# Patient Record
Sex: Female | Born: 1970 | Race: Black or African American | Hispanic: No | Marital: Single | Smoking: Never smoker
Health system: Southern US, Community
[De-identification: ages and names within clinical notes are randomized; demographics above are authoritative.]

## PROBLEM LIST (undated history)

## (undated) DIAGNOSIS — N840 Polyp of corpus uteri: Secondary | ICD-10-CM

## (undated) DIAGNOSIS — N926 Irregular menstruation, unspecified: Secondary | ICD-10-CM

## (undated) DIAGNOSIS — Z973 Presence of spectacles and contact lenses: Secondary | ICD-10-CM

---

## 2013-07-11 ENCOUNTER — Other Ambulatory Visit (HOSPITAL_COMMUNITY): Payer: Self-pay | Admitting: Obstetrics and Gynecology

## 2013-07-11 DIAGNOSIS — E041 Nontoxic single thyroid nodule: Secondary | ICD-10-CM

## 2013-07-16 ENCOUNTER — Ambulatory Visit (HOSPITAL_COMMUNITY): Payer: BC Managed Care – PPO

## 2013-07-18 ENCOUNTER — Ambulatory Visit (HOSPITAL_COMMUNITY): Admission: RE | Admit: 2013-07-18 | Payer: BC Managed Care – PPO | Source: Ambulatory Visit

## 2013-07-19 ENCOUNTER — Ambulatory Visit (HOSPITAL_COMMUNITY)
Admission: RE | Admit: 2013-07-19 | Discharge: 2013-07-19 | Disposition: A | Discharge status: Home / Self Care | Payer: BC Managed Care – PPO | Source: Ambulatory Visit | Attending: Obstetrics and Gynecology | Admitting: Obstetrics and Gynecology

## 2013-07-19 DIAGNOSIS — Z1329 Encounter for screening for other suspected endocrine disorder: Secondary | ICD-10-CM | POA: Insufficient documentation

## 2013-07-19 DIAGNOSIS — E041 Nontoxic single thyroid nodule: Secondary | ICD-10-CM

## 2014-02-23 NOTE — H&P (Signed)
  Patient name  Megan Quinn, Megan Quinn DICTATION# [id number] CSN# [id number]  [facility] Center[facility]CCOMB,Romulo Okray S, MD 02/23/2014 10:14 AM

## 2014-02-24 ENCOUNTER — Encounter (HOSPITAL_BASED_OUTPATIENT_CLINIC_OR_DEPARTMENT_OTHER): Payer: Self-pay | Admitting: *Deleted

## 2014-02-24 NOTE — H&P (Signed)
NAME:  Megan Quinn, [name]                  ACCOUNT NO.:  [id number]  MEDICAL RECORD NO.:  [id number]  LOCATION:                               [facility]  PHYSICIAN:  Juluis MireJohn S. Margean Korell, M.D.   DATE OF BIRTH:  Dec 02, 1970  DATE OF ADMISSION: DATE OF DISCHARGE:                             HISTORY & PHYSICAL   Patient is a 44 year old gravida 2, para 1, female presents for hysteroscopy with MyoSure resection of several endometrial polyps.  In relation to the present admission, the patient initially was seen in our practice on May 26th.  Cycles were regular, but heavy at that time. They became progressively irregular and heavier with resultant drop in hemoglobin.  Subsequent saline infusion.  Ultrasound did reveal several large endometrial polyps for which now she presents for resection.  ALLERGIES:  In terms of allergies, she lists no known drug allergies.  MEDICATIONS:  No medications.  PAST MEDICAL HISTORY:  Usual childhood diseases.  No sequelae.  PAST SURGICAL HISTORY:  No surgical procedures listed.  FAMILY HISTORY:  Noncontributory.  SOCIAL HISTORY:  No tobacco or alcohol use.  REVIEW OF SYSTEMS:  Noncontributory.  PHYSICAL EXAMINATION:  VITAL SIGNS:  The patient is afebrile.  Stable vital signs. HEENT:  The patient is normocephalic.  Pupils equal, round, and reactive to light and accommodation.  Extraocular movements are intact.  Sclerae and conjunctivae are clear.  Oropharynx clear. NECK:  Not examined. LUNGS:  Clear. CARDIOVASCULAR SYSTEM:  Regular rate.  No murmurs or gallops. ABDOMEN:  Benign.  No mass, organomegaly, or tenderness. PELVIC:  Normal external genitalia.  Vaginal mucosa is clear.  Cervix unremarkable.  Uterus normal size, shape, and contour.  Adnexa free of mass or tenderness. EXTREMITIES:  Trace edema.  NEUROLOGIC:  Grossly within normal limits.  IMPRESSION:  Abnormal uterine bleeding with endometrial polyp.  PLAN:  The patient will undergo cervical  dilation, hysteroscopy, and resection of the polyps using the MyoSure, subsequently uterine curetting risk have been discussed including the risk of infection.  The risk of vascular injury that could lead to hemorrhage with requirement of transfusion with the risk of AIDS or hepatitis.  Excessive bleeding could require hysterectomy.  There is a risk of perforation with injury to adjacent organs such as bowel or bladder, this could require further exploratory surgery.  Risk of deep venous thrombosis and pulmonary embolus.  The patient expressed her understanding of indications and risk.     [name], M.D.     JSM/MEDQ  D:  02/23/2014  T:  02/23/2014  Job:  [id number]

## 2014-02-25 ENCOUNTER — Encounter (HOSPITAL_BASED_OUTPATIENT_CLINIC_OR_DEPARTMENT_OTHER): Payer: Self-pay | Admitting: *Deleted

## 2014-02-25 NOTE — Progress Notes (Signed)
NPO AFTER MN. ARRIVE AT 0600. PT TO GET LAB WORK DONE Wednesday 02-26-2013.

## 2014-02-26 DIAGNOSIS — N840 Polyp of corpus uteri: Secondary | ICD-10-CM | POA: Diagnosis present

## 2014-02-26 LAB — CBC
HEMATOCRIT: 31.7 % — AB (ref 36.0–46.0)
HEMOGLOBIN: 9.9 g/dL — AB (ref 12.0–15.0)
MCH: 26.3 pg (ref 26.0–34.0)
MCHC: 31.2 g/dL (ref 30.0–36.0)
MCV: 84.1 fL (ref 78.0–100.0)
Platelets: 425 10*3/uL — ABNORMAL HIGH (ref 150–400)
RBC: 3.77 MIL/uL — ABNORMAL LOW (ref 3.87–5.11)
RDW: 13.4 % (ref 11.5–15.5)
WBC: 6.5 10*3/uL (ref 4.0–10.5)

## 2014-02-26 LAB — HCG, SERUM, QUALITATIVE: Preg, Serum: NEGATIVE

## 2014-02-27 ENCOUNTER — Encounter (HOSPITAL_BASED_OUTPATIENT_CLINIC_OR_DEPARTMENT_OTHER)
Admission: RE | Disposition: A | Discharge status: Home / Self Care | Payer: Self-pay | Source: Ambulatory Visit | Attending: Obstetrics and Gynecology

## 2014-02-27 ENCOUNTER — Encounter (HOSPITAL_BASED_OUTPATIENT_CLINIC_OR_DEPARTMENT_OTHER): Payer: Self-pay | Admitting: *Deleted

## 2014-02-27 ENCOUNTER — Ambulatory Visit (HOSPITAL_BASED_OUTPATIENT_CLINIC_OR_DEPARTMENT_OTHER): Payer: BLUE CROSS/BLUE SHIELD | Admitting: Anesthesiology

## 2014-02-27 ENCOUNTER — Ambulatory Visit (HOSPITAL_BASED_OUTPATIENT_CLINIC_OR_DEPARTMENT_OTHER)
Admission: RE | Admit: 2014-02-27 | Discharge: 2014-02-27 | Disposition: A | Discharge status: Home / Self Care | Payer: BLUE CROSS/BLUE SHIELD | Source: Ambulatory Visit | Attending: Obstetrics and Gynecology | Admitting: Obstetrics and Gynecology

## 2014-02-27 DIAGNOSIS — Z9889 Other specified postprocedural states: Secondary | ICD-10-CM

## 2014-02-27 DIAGNOSIS — N840 Polyp of corpus uteri: Secondary | ICD-10-CM | POA: Diagnosis present

## 2014-02-27 HISTORY — DX: Presence of spectacles and contact lenses: Z97.3

## 2014-02-27 HISTORY — PX: DILATATION & CURETTAGE/HYSTEROSCOPY WITH MYOSURE: SHX6511

## 2014-02-27 HISTORY — DX: Irregular menstruation, unspecified: N92.6

## 2014-02-27 HISTORY — DX: Polyp of corpus uteri: N84.0

## 2014-02-27 SURGERY — DILATATION & CURETTAGE/HYSTEROSCOPY WITH MYOSURE
Anesthesia: General | Site: Vagina

## 2014-02-27 MED ORDER — LIDOCAINE HCL (CARDIAC) 20 MG/ML IV SOLN
INTRAVENOUS | Status: DC | PRN
  Administered 2014-02-27: 80 mg via INTRAVENOUS

## 2014-02-27 MED ORDER — HYDROMORPHONE HCL 1 MG/ML IJ SOLN
0.2500 mg | INTRAMUSCULAR | Status: DC | PRN
  Filled 2014-02-27: qty 1

## 2014-02-27 MED ORDER — MIDAZOLAM HCL 2 MG/2ML IJ SOLN
INTRAMUSCULAR | Status: AC
  Filled 2014-02-27: qty 2

## 2014-02-27 MED ORDER — MIDAZOLAM HCL 5 MG/5ML IJ SOLN
INTRAMUSCULAR | Status: DC | PRN
  Administered 2014-02-27: 2 mg via INTRAVENOUS

## 2014-02-27 MED ORDER — FENTANYL CITRATE 0.05 MG/ML IJ SOLN
INTRAMUSCULAR | Status: DC | PRN
  Administered 2014-02-27: 100 ug via INTRAVENOUS

## 2014-02-27 MED ORDER — ACETAMINOPHEN 10 MG/ML IV SOLN
INTRAVENOUS | Status: DC | PRN
  Administered 2014-02-27: 1000 mg via INTRAVENOUS

## 2014-02-27 MED ORDER — ONDANSETRON HCL 4 MG/2ML IJ SOLN
INTRAMUSCULAR | Status: DC | PRN
  Administered 2014-02-27: 4 mg via INTRAVENOUS

## 2014-02-27 MED ORDER — PROPOFOL 10 MG/ML IV BOLUS
INTRAVENOUS | Status: DC | PRN
  Administered 2014-02-27: 200 mg via INTRAVENOUS
  Administered 2014-02-27: 50 mg via INTRAVENOUS

## 2014-02-27 MED ORDER — CEFAZOLIN SODIUM-DEXTROSE 2-3 GM-% IV SOLR
INTRAVENOUS | Status: AC
  Filled 2014-02-27: qty 50

## 2014-02-27 MED ORDER — LACTATED RINGERS IV SOLN
INTRAVENOUS | Status: DC
  Administered 2014-02-27: 07:00:00 via INTRAVENOUS
  Filled 2014-02-27: qty 1000

## 2014-02-27 MED ORDER — LIDOCAINE-EPINEPHRINE 1 %-1:100000 IJ SOLN
INTRAMUSCULAR | Status: DC | PRN
Start: 2014-02-27 — End: 2014-02-27
  Administered 2014-02-27: 15 mL

## 2014-02-27 MED ORDER — SODIUM CHLORIDE 0.9 % IR SOLN
Status: DC | PRN
  Administered 2014-02-27: 500 mL
  Administered 2014-02-27: 3000 mL

## 2014-02-27 MED ORDER — FENTANYL CITRATE 0.05 MG/ML IJ SOLN
INTRAMUSCULAR | Status: AC
  Filled 2014-02-27: qty 2

## 2014-02-27 MED ORDER — DEXAMETHASONE SODIUM PHOSPHATE 4 MG/ML IJ SOLN
INTRAMUSCULAR | Status: DC | PRN
  Administered 2014-02-27: 10 mg via INTRAVENOUS

## 2014-02-27 MED ORDER — CEFAZOLIN SODIUM-DEXTROSE 2-3 GM-% IV SOLR
2.0000 g | INTRAVENOUS | Status: AC
  Administered 2014-02-27: 2 g via INTRAVENOUS
  Filled 2014-02-27: qty 50

## 2014-02-27 SURGICAL SUPPLY — 28 items
ABLATOR ENDOMETRIAL MYOSURE (ABLATOR) ×3 IMPLANT
AQUILEX TUBING ×3 IMPLANT
CANISTER SUCTION 2500CC (MISCELLANEOUS) ×3 IMPLANT
CATH ROBINSON RED A/P 16FR (CATHETERS) ×3 IMPLANT
COVER TABLE BACK 60X90 (DRAPES) ×3 IMPLANT
DRAPE CAMERA CLOSED 9X96 (DRAPES) ×3 IMPLANT
DRAPE LG THREE QUARTER DISP (DRAPES) ×3 IMPLANT
DRESSING TELFA ISLAND 4X8 (GAUZE/BANDAGES/DRESSINGS) ×3 IMPLANT
ELECT REM PT RETURN 9FT ADLT (ELECTROSURGICAL) ×3
ELECTRODE REM PT RTRN 9FT ADLT (ELECTROSURGICAL) ×1 IMPLANT
GLOVE BIO SURGEON STRL SZ7 (GLOVE) ×6 IMPLANT
GLOVE BIOGEL PI IND STRL 6.5 (GLOVE) ×1 IMPLANT
GLOVE BIOGEL PI IND STRL 7.5 (GLOVE) ×1 IMPLANT
GLOVE BIOGEL PI INDICATOR 6.5 (GLOVE) ×2
GLOVE BIOGEL PI INDICATOR 7.5 (GLOVE) ×2
GLOVE SURG SS PI 7.5 STRL IVOR (GLOVE) ×3 IMPLANT
GOWN STRL REUS W/ TWL XL LVL3 (GOWN DISPOSABLE) ×2 IMPLANT
GOWN STRL REUS W/TWL XL LVL3 (GOWN DISPOSABLE) ×4
IV NS IRRIG 3000ML ARTHROMATIC (IV SOLUTION) ×3 IMPLANT
LEGGING LITHOTOMY PAIR STRL (DRAPES) ×3 IMPLANT
NS IRRIG 500ML POUR BTL (IV SOLUTION) ×3 IMPLANT
PACK BASIN DAY SURGERY FS (CUSTOM PROCEDURE TRAY) ×3 IMPLANT
PAD OB MATERNITY 4.3X12.25 (PERSONAL CARE ITEMS) ×3 IMPLANT
PAD PREP 24X48 CUFFED NSTRL (MISCELLANEOUS) ×3 IMPLANT
SEAL ROD LENS SCOPE MYOSURE (ABLATOR) ×3 IMPLANT
TOWEL OR 17X24 6PK STRL BLUE (TOWEL DISPOSABLE) ×6 IMPLANT
TUBING AQUILEX INFLOW (TUBING) ×3 IMPLANT
TUBING AQUILEX OUTFLOW (TUBING) ×3 IMPLANT

## 2014-02-27 NOTE — Op Note (Signed)
NAMRosalene Quinn:  Patti, Cierra                  ACCOUNT NO.:  [id number]  MEDICAL RECORD NO.:  [id number]  LOCATION:                                 FACILITY:  PHYSICIAN:  Juluis MireJohn S. Louise Victory, M.D.   DATE OF BIRTH:  07-Aug-1970  DATE OF PROCEDURE:  02/27/2014 DATE OF DISCHARGE:  02/27/2014                              OPERATIVE REPORT   PREOPERATIVE DIAGNOSIS:  Endometrial polyp.  POSTOPERATIVE DIAGNOSIS:  Endometrial polyp.  OPERATIVE PROCEDURE:  Paracervical block, cervical dilatation, hysteroscopy with MyoSure resection of 2 endometrial polyps. Endometrial curettings.  SURGEON:  Juluis MireJohn S. Juliany Daughety, MD  ANESTHESIA:  General and paracervical block.  BLOOD LOSS:  Minimal.  PACKS AND DRAINS:  None.  INTRAOPERATIVE BLOOD PLACED:  None.  COMPLICATIONS:  None.  INDICATIONS:  Dictated in history and physical.  PROCEDURE IN DETAIL:  The patient was taken to the OR and placed in supine position.  After satisfactory level of general anesthesia was obtained, the patient was placed in the dorsal supine position using the Allen stirrups.  Perineum and vagina were prepped out with Betadine and draped as a sterile field.  A speculum was placed in the vaginal vault. The cervix was grasped with single-tooth tenaculum.  Paracervical block 1% Xylocaine with epinephrine was instituted.  Anterior lip of the cervix was grasped with single-tooth tenaculum.  Uterus sounded to approximately 9 cm.  Cervix was dilated to a size 23 Pratt dilator. Hysteroscope was introduced.  Intrauterine cavity was distended using saline.  Visualization revealed 2 posterior wall endometrial polyps. MyoSure was brought in place.  Both polyps were completely resected. The endometrium was smooth and both polyps were completely resected. There was no signs of perforation or active problems, deficit was 400 mL.  The hysteroscope was then removed.  Endometrial curettings were obtained and sent for cytology. At this point, the  single-tooth tenaculum and speculum then removed. The patient taken out of dorsal lithotomy position.  Once alert, transferred to recovery room in good condition.  Sponge, instrument, and needle counts were correct by circulating nurse.     Juluis MireJohn S. Jelani Vreeland, M.D.     JSM/MEDQ  D:  02/27/2014  T:  02/27/2014  Job:  [id number]

## 2014-02-27 NOTE — Discharge Instructions (Signed)
Dilation and Curettage or Vacuum Curettage, Care After These instructions give you information on caring for yourself after your procedure. Your doctor may also give you more specific instructions. Call your doctor if you have any problems or questions after your procedure. HOME CARE  Do not drive for 24 hours.  Wait 1 week before doing any activities that wear you out.  Take your temperature 2 times a day for 4 days. Write it down. Tell your doctor if you have a fever.  Do not stand for a long time.  Do not lift, push, or pull anything over 10 pounds (4.5 kilograms).  Limit stair climbing to once or twice a day.  Rest often.  Continue with your usual diet.  Drink enough fluids to keep your pee (urine) clear or pale yellow.  If you have a hard time pooping (constipation), you may:  Take a medicine to help you go poop (laxative) as told by your doctor.  Eat more fruit and bran.  Drink more fluids.  Take showers, not baths, for as long as told by your doctor.  Do not swim or use a hot tub until your doctor says it is okay.  Have someone with you for 1-2 days after the procedure.  Do not douche, use tampons, or have sex (intercourse) for 2 weeks.  Only take medicines as told by your doctor. Do not take aspirin. It can cause bleeding.  Keep all doctor visits. GET HELP IF:  You have cramps or pain not helped by medicine.  You have new pain in the belly (abdomen).  You have a bad smelling fluid coming from your vagina.  You have a rash.  You have problems with any medicine. GET HELP RIGHT AWAY IF:   You start to bleed more than a regular period.  You have a fever.  You have chest pain.  You have trouble breathing.  You feel dizzy or feel like passing out (fainting).  You pass out.  You have pain in the tops of your shoulders.  You have vaginal bleeding with or without clumps of blood (blood clots). MAKE SURE YOU:  Understand these instructions.  Will  watch your condition.  Will get help right away if you are not doing well or get worse. Document Released: 11/10/2007 Document Revised: 02/05/2013 Document Reviewed: 08/30/2012 [facility] Patient Information 2015 [employer] [place]. This information is not intended to replace advice given to you by your health care provider. Make sure you discuss any questions you have with your health care provider.    Post Anesthesia Home Care Instructions  Activity: Get plenty of rest for the remainder of the day. A responsible adult should stay with you for 24 hours following the procedure.  For the next 24 hours, DO NOT: -Drive a car -Advertising copywriterperate machinery -Drink alcoholic beverages -Take any medication unless instructed by your physician -Make any legal decisions or sign important papers.  Meals: Start with liquid foods such as gelatin or soup. Progress to regular foods as tolerated. Avoid greasy, spicy, heavy foods. If nausea and/or vomiting occur, drink only clear liquids until the nausea and/or vomiting subsides. Call your physician if vomiting continues.  Special Instructions/Symptoms: Your throat may feel dry or sore from the anesthesia or the breathing tube placed in your throat during surgery. If this causes discomfort, gargle with warm salt water. The discomfort should disappear within 24 hours.

## 2014-02-27 NOTE — H&P (Signed)
  History and physical exam unchanged 

## 2014-02-27 NOTE — Transfer of Care (Signed)
Immediate Anesthesia Transfer of Care Note  Patient: Megan BillingsPia Carraway  Procedure(s) Performed: Procedure(s): DILATATION & CURETTAGE/HYSTEROSCOPY WITH MYOSURE (N/A)  Patient Location: PACU  Anesthesia Type:General  Level of Consciousness: sedated and patient cooperative  Airway & Oxygen Therapy: Patient Spontanous Breathing and Patient connected to nasal cannula oxygen  Post-op Assessment: Report given to PACU RN and Post -op Vital signs reviewed and stable  Post vital signs: Reviewed and stable  Complications: No apparent anesthesia complications

## 2014-02-27 NOTE — Op Note (Signed)
Patient name  Megan Quinn, [name] DICTATION# [id number] CSN# [id number]  [name],[name], MD 02/27/2014 7:54 AM

## 2014-02-27 NOTE — Anesthesia Procedure Notes (Signed)
Procedure Name: LMA Insertion Date/Time: 02/27/2014 7:29 AM Performed by: Tyrone NineSAUVE, Beuna Bolding F Pre-anesthesia Checklist: Patient identified, Timeout performed, Emergency Drugs available, Suction available and Patient being monitored Patient Re-evaluated:Patient Re-evaluated prior to inductionOxygen Delivery Method: Circle system utilized Preoxygenation: Pre-oxygenation with 100% oxygen Intubation Type: IV induction Ventilation: Mask ventilation without difficulty LMA: LMA inserted LMA Size: 4.0 Number of attempts: 1 Airway Equipment and Method: Bite block Placement Confirmation: positive ETCO2 Tube secured with: Tape Dental Injury: Dental damage

## 2014-02-27 NOTE — Anesthesia Postprocedure Evaluation (Signed)
  Anesthesia Post-op Note  Patient: Megan BillingsPia Quinn  Procedure(s) Performed: Procedure(s): DILATATION & CURETTAGE/HYSTEROSCOPY WITH MYOSURE (N/A)  Patient Location: PACU  Anesthesia Type:General  Level of Consciousness: awake and alert   Airway and Oxygen Therapy: Patient Spontanous Breathing  Post-op Pain: none  Post-op Assessment: Post-op Vital signs reviewed, Patient's Cardiovascular Status Stable and Respiratory Function Stable  Post-op Vital Signs: Reviewed  Filed Vitals:   02/27/14 0900  BP: 123/80  Pulse: 75  Temp:   Resp: 12    Complications: No apparent anesthesia complications

## 2014-02-27 NOTE — Anesthesia Preprocedure Evaluation (Addendum)
Anesthesia Evaluation  Patient identified by MRN, date of birth, ID band Patient awake    Reviewed: Allergy & Precautions, H&P , NPO status , Patient's Chart, lab work & pertinent test results  Airway Mallampati: II  TM Distance: >3 FB Neck ROM: Full    Dental no notable dental hx. (+) Teeth Intact, Dental Advisory Given   Pulmonary neg pulmonary ROS,  breath sounds clear to auscultation  Pulmonary exam normal       Cardiovascular Exercise Tolerance: Good negative cardio ROS  Rhythm:Regular Rate:Normal     Neuro/Psych negative neurological ROS  negative psych ROS   GI/Hepatic negative GI ROS, Neg liver ROS,   Endo/Other  negative endocrine ROS  Renal/GU negative Renal ROS  negative genitourinary   Musculoskeletal   Abdominal   Peds  Hematology negative hematology ROS (+)   Anesthesia Other Findings   Reproductive/Obstetrics negative OB ROS                            Anesthesia Physical Anesthesia Plan  ASA: I  Anesthesia Plan: General   Post-op Pain Management:    Induction: Intravenous  Airway Management Planned: LMA and Oral ETT  Additional Equipment:   Intra-op Plan:   Post-operative Plan:   Informed Consent: I have reviewed the patients History and Physical, chart, labs and discussed the procedure including the risks, benefits and alternatives for the proposed anesthesia with the patient or authorized representative who has indicated his/her understanding and acceptance.   Dental advisory given  Plan Discussed with: CRNA  Anesthesia Plan Comments:         Anesthesia Quick Evaluation

## 2014-02-28 ENCOUNTER — Encounter (HOSPITAL_BASED_OUTPATIENT_CLINIC_OR_DEPARTMENT_OTHER): Payer: Self-pay | Admitting: Obstetrics and Gynecology

## 2014-09-17 ENCOUNTER — Other Ambulatory Visit: Payer: Self-pay | Admitting: Obstetrics and Gynecology

## 2014-09-18 LAB — CYTOLOGY - PAP

## 2015-12-24 ENCOUNTER — Encounter: Payer: Self-pay | Admitting: Obstetrics and Gynecology

## 2018-04-03 HISTORY — PX: REDUCTION MAMMAPLASTY: SUR839

## 2018-10-12 ENCOUNTER — Other Ambulatory Visit: Payer: Self-pay | Admitting: Obstetrics and Gynecology

## 2018-10-12 DIAGNOSIS — N6310 Unspecified lump in the right breast, unspecified quadrant: Secondary | ICD-10-CM

## 2018-10-12 LAB — HM PAP SMEAR: HM Pap smear: NORMAL

## 2018-10-26 ENCOUNTER — Ambulatory Visit
Admission: RE | Admit: 2018-10-26 | Discharge: 2018-10-26 | Disposition: A | Discharge status: Home / Self Care | Payer: BLUE CROSS/BLUE SHIELD | Source: Ambulatory Visit | Attending: Obstetrics and Gynecology | Admitting: Obstetrics and Gynecology

## 2018-10-26 ENCOUNTER — Other Ambulatory Visit: Payer: Self-pay | Admitting: Obstetrics and Gynecology

## 2018-10-26 ENCOUNTER — Ambulatory Visit
Admission: RE | Admit: 2018-10-26 | Discharge: 2018-10-26 | Disposition: A | Discharge status: Home / Self Care | Payer: BC Managed Care – PPO | Source: Ambulatory Visit | Attending: Obstetrics and Gynecology | Admitting: Obstetrics and Gynecology

## 2018-10-26 ENCOUNTER — Other Ambulatory Visit: Payer: Self-pay

## 2018-10-26 DIAGNOSIS — N6320 Unspecified lump in the left breast, unspecified quadrant: Secondary | ICD-10-CM

## 2018-10-26 DIAGNOSIS — N6310 Unspecified lump in the right breast, unspecified quadrant: Secondary | ICD-10-CM

## 2018-12-20 LAB — BASIC METABOLIC PANEL
BUN: 12 (ref 4–21)
CO2: 21 (ref 13–22)
Chloride: 102 (ref 99–108)
Creatinine: 1.1 (ref 0.5–1.1)
Glucose: 103
Potassium: 4.6 (ref 3.4–5.3)
Sodium: 140 (ref 137–147)

## 2018-12-20 LAB — COMPREHENSIVE METABOLIC PANEL
Albumin: 4.4 (ref 3.5–5.0)
Calcium: 10 (ref 8.7–10.7)
GFR calc Af Amer: 72
GFR calc non Af Amer: 63
Globulin: 2.9

## 2018-12-20 LAB — HEPATIC FUNCTION PANEL
ALT: 17 (ref 7–35)
AST: 20 (ref 13–35)
Alkaline Phosphatase: 45 (ref 25–125)
Bilirubin, Total: 0.4

## 2018-12-20 LAB — TSH: TSH: 2.94 (ref 0.41–5.90)

## 2018-12-20 LAB — LIPID PANEL
Cholesterol: 266 — AB (ref 0–200)
HDL: 57 (ref 35–70)
LDL Cholesterol: 191
Triglycerides: 103 (ref 40–160)

## 2019-03-13 ENCOUNTER — Encounter: Payer: Self-pay | Admitting: Obstetrics and Gynecology

## 2019-05-20 ENCOUNTER — Ambulatory Visit: Payer: BC Managed Care – PPO | Admitting: Family Medicine

## 2019-05-20 DIAGNOSIS — Z0289 Encounter for other administrative examinations: Secondary | ICD-10-CM

## 2019-10-07 ENCOUNTER — Other Ambulatory Visit: Payer: Self-pay

## 2019-10-07 ENCOUNTER — Observation Stay (HOSPITAL_COMMUNITY)
Admission: EM | Admit: 2019-10-07 | Discharge: 2019-10-08 | Disposition: A | Discharge status: Home / Self Care | Payer: BC Managed Care – PPO | Attending: Obstetrics & Gynecology | Admitting: Obstetrics & Gynecology

## 2019-10-07 DIAGNOSIS — D500 Iron deficiency anemia secondary to blood loss (chronic): Secondary | ICD-10-CM | POA: Insufficient documentation

## 2019-10-07 DIAGNOSIS — D62 Acute posthemorrhagic anemia: Secondary | ICD-10-CM

## 2019-10-07 DIAGNOSIS — N939 Abnormal uterine and vaginal bleeding, unspecified: Secondary | ICD-10-CM | POA: Diagnosis not present

## 2019-10-07 DIAGNOSIS — R0602 Shortness of breath: Secondary | ICD-10-CM | POA: Insufficient documentation

## 2019-10-07 DIAGNOSIS — D649 Anemia, unspecified: Secondary | ICD-10-CM | POA: Diagnosis present

## 2019-10-07 DIAGNOSIS — R42 Dizziness and giddiness: Secondary | ICD-10-CM | POA: Insufficient documentation

## 2019-10-07 DIAGNOSIS — N920 Excessive and frequent menstruation with regular cycle: Secondary | ICD-10-CM

## 2019-10-07 DIAGNOSIS — R000 Tachycardia, unspecified: Secondary | ICD-10-CM | POA: Insufficient documentation

## 2019-10-07 DIAGNOSIS — Z20822 Contact with and (suspected) exposure to covid-19: Secondary | ICD-10-CM | POA: Diagnosis not present

## 2019-10-07 LAB — COMPREHENSIVE METABOLIC PANEL
ALT: 15 U/L (ref 0–44)
AST: 20 U/L (ref 15–41)
Albumin: 3.4 g/dL — ABNORMAL LOW (ref 3.5–5.0)
Alkaline Phosphatase: 23 U/L — ABNORMAL LOW (ref 38–126)
Anion gap: 7 (ref 5–15)
BUN: 8 mg/dL (ref 6–20)
CO2: 25 mmol/L (ref 22–32)
Calcium: 8.6 mg/dL — ABNORMAL LOW (ref 8.9–10.3)
Chloride: 106 mmol/L (ref 98–111)
Creatinine, Ser: 0.98 mg/dL (ref 0.44–1.00)
GFR calc Af Amer: >60 mL/min (ref >60)
GFR calc non Af Amer: >60 mL/min (ref >60)
Glucose, Bld: 100 mg/dL — ABNORMAL HIGH (ref 70–99)
Potassium: 4.1 mmol/L (ref 3.5–5.1)
Sodium: 138 mmol/L (ref 135–145)
Total Bilirubin: 0.6 mg/dL (ref 0.3–1.2)
Total Protein: 6.1 g/dL — ABNORMAL LOW (ref 6.5–8.1)

## 2019-10-07 LAB — CBC
HCT: 18.3 % — ABNORMAL LOW (ref 36.0–46.0)
Hemoglobin: 5.6 g/dL — CL (ref 12.0–15.0)
MCH: 28.3 pg (ref 26.0–34.0)
MCHC: 30.6 g/dL (ref 30.0–36.0)
MCV: 92.4 fL (ref 80.0–100.0)
Platelets: 421 10*3/uL — ABNORMAL HIGH (ref 150–400)
RBC: 1.98 MIL/uL — ABNORMAL LOW (ref 3.87–5.11)
RDW: 11.9 % (ref 11.5–15.5)
WBC: 8.1 10*3/uL (ref 4.0–10.5)
nRBC: 0 % (ref 0.0–0.2)

## 2019-10-07 LAB — I-STAT BETA HCG BLOOD, ED (MC, WL, AP ONLY): I-stat hCG, quantitative: <5 m[IU]/mL (ref <5)

## 2019-10-07 NOTE — ED Triage Notes (Signed)
Pt reports heavy vaginal bleeding for the last 2 weeks. Per patient was told to come to ER for blood transfusion due to low hemoglobin. PT awake, alert, appropriate, NAD at present.

## 2019-10-08 ENCOUNTER — Observation Stay (HOSPITAL_COMMUNITY): Payer: BC Managed Care – PPO

## 2019-10-08 DIAGNOSIS — D649 Anemia, unspecified: Secondary | ICD-10-CM | POA: Diagnosis present

## 2019-10-08 LAB — HEMOGLOBIN AND HEMATOCRIT, BLOOD
HCT: 26.3 % — ABNORMAL LOW (ref 36.0–46.0)
Hemoglobin: 8.3 g/dL — ABNORMAL LOW (ref 12.0–15.0)

## 2019-10-08 LAB — ABO/RH: ABO/RH(D): A POS

## 2019-10-08 LAB — PREPARE RBC (CROSSMATCH)

## 2019-10-08 LAB — SARS CORONAVIRUS 2 BY RT PCR (HOSPITAL ORDER, PERFORMED IN ~~LOC~~ HOSPITAL LAB): SARS Coronavirus 2: NEGATIVE

## 2019-10-08 MED ORDER — SODIUM CHLORIDE 0.9% IV SOLUTION: Freq: Once | INTRAVENOUS | Status: DC

## 2019-10-08 NOTE — H&P (Signed)
Megan Quinn is an 49 y.o. female sent from office yesterday for blood transfusion when she presented for evaluation of heavy vaginal bleeding with hemoglobin of 4.8.  The patient states she has been experiencing irregular menses for several months now and when she has been seen by her primary, Dr. Arelia Sneddon, was diagnosed with perimenopause.  Her symptoms were tolerated until 8/6 when she began having extremely heavy vaginal bleeding requiring her to leave work [employer]).  She continued to have almost daily bleeding since that time which is occasionally extremely heavy; never painful.  She noticed her heart racing and severe fatigue which is not typical for her.  Historically, the patient was diagnosed with endometrial polyps in 01/2014 and underwent H/S, D&C with benign pathology.  A few small (2 cm and smaller) fibroids were noted at that time.  Patient's partner is s/p vasectomy and patient takes no hormonal medication.  Also of note, patient has h/o cervical dysplasia and had LEEP in 2019.  Currently, the patient reports no vaginal bleeding since yesterday.  She reports no cardiac symptoms but she has not tried standing for some time.  She denies CP/SOB.  Patient is otherwise in good health with h/o anemia years ago with hemoglobin 9.    Pertinent Gynecological History: Menses: flow is excessive with use of innumberable pads or tampons on heaviest days Bleeding: dysfunctional uterine bleeding Contraception: vasectomy DES exposure: unknown Blood transfusions: currently receiving; none previously Sexually transmitted diseases: no past history Previous GYN Procedures: DNC, LEEP Last mammogram: normal Date: 10/26/2018 Last pap: normal Date: 04/16/2019 OB History: G2, P1011   Menstrual History: Menarche age: n/a Patient's last menstrual period was 09/23/2019.    Past Medical History:  Diagnosis Date  . Endometrial polyp   . Irregular menstrual cycle   . Wears contact lenses      Past Surgical History:  Procedure Laterality Date  . DILATATION & CURETTAGE/HYSTEROSCOPY WITH MYOSURE N/A 02/27/2014   Procedure: DILATATION & CURETTAGE/HYSTEROSCOPY WITH MYOSURE;  Surgeon: Juluis Mire, MD;  Location: [facility];  Service: Gynecology;  Laterality: N/A;  . REDUCTION MAMMAPLASTY Bilateral 04/03/2018    No family history on file.  Social History:  reports that she has never smoked. She has never used smokeless tobacco. She reports that she does not drink alcohol and does not use drugs.  Allergies:  Allergies  Allergen Reactions  . Ibuprofen Swelling    EYES SWELL    (Not in a hospital admission)   Review of Systems  Blood pressure 105/72, pulse 82, temperature 98.5 F (36.9 C), temperature source Oral, resp. rate 20, height 5\' 4"  (1.626 m), weight 74.8 kg, last menstrual period 09/23/2019, SpO2 100 %. Physical Exam Constitutional:      Appearance: Normal appearance.  HENT:     Head: Normocephalic.  Cardiovascular:     Rate and Rhythm: Normal rate and regular rhythm.  Pulmonary:     Effort: Pulmonary effort is normal.  Abdominal:     General: There is no distension.     Palpations: Abdomen is soft. There is no mass.     Tenderness: There is no abdominal tenderness. There is no guarding or rebound.  Genitourinary:    General: Normal vulva.     Comments: No current vaginal bleeding on exam  Musculoskeletal:        General: Normal range of motion.     Cervical back: Normal range of motion.  Skin:    General: Skin is warm and dry.  Neurological:     Mental Status: She is alert.  Psychiatric:        Mood and Affect: Mood normal.        Behavior: Behavior normal.     Results for orders placed or performed during the hospital encounter of 10/07/19 (from the past 24 hour(s))  CBC     Status: Abnormal   Collection Time: 10/07/19  2:09 PM  Result Value Ref Range   WBC 8.1 4.0 - 10.5 K/uL   RBC 1.98 (L) 3.87 - 5.11 MIL/uL    Hemoglobin 5.6 (LL) 12.0 - 15.0 g/dL   HCT 16.1 (L) 36 - 46 %   MCV 92.4 80.0 - 100.0 fL   MCH 28.3 26.0 - 34.0 pg   MCHC 30.6 30.0 - 36.0 g/dL   RDW 09.6 04.5 - 40.9 %   Platelets 421 (H) 150 - 400 K/uL   nRBC 0.0 0.0 - 0.2 %  Comprehensive metabolic panel     Status: Abnormal   Collection Time: 10/07/19  2:09 PM  Result Value Ref Range   Sodium 138 135 - 145 mmol/L   Potassium 4.1 3.5 - 5.1 mmol/L   Chloride 106 98 - 111 mmol/L   CO2 25 22 - 32 mmol/L   Glucose, Bld 100 (H) 70 - 99 mg/dL   BUN 8 6 - 20 mg/dL   Creatinine, Ser 8.11 0.44 - 1.00 mg/dL   Calcium 8.6 (L) 8.9 - 10.3 mg/dL   Total Protein 6.1 (L) 6.5 - 8.1 g/dL   Albumin 3.4 (L) 3.5 - 5.0 g/dL   AST 20 15 - 41 U/L   ALT 15 0 - 44 U/L   Alkaline Phosphatase 23 (L) 38 - 126 U/L   Total Bilirubin 0.6 0.3 - 1.2 mg/dL   GFR calc non Af Amer >60 >60 mL/min   GFR calc Af Amer >60 >60 mL/min   Anion gap 7 5 - 15  Type and screen [facility]     Status: None (Preliminary result)   Collection Time: 10/07/19  2:09 PM  Result Value Ref Range   ABO/RH(D) A POS    Antibody Screen NEG    Sample Expiration 10/10/2019,2359    Unit Number [id number]    Blood Component Type RED CELLS,LR    Unit division 00    Status of Unit ISSUED    Transfusion Status OK TO TRANSFUSE    Crossmatch Result      Compatible Performed at [facility], [address] [place], [place] [address]    Unit Number [id number]    Blood Component Type RED CELLS,LR    Unit division 00    Status of Unit ALLOCATED    Transfusion Status OK TO TRANSFUSE    Crossmatch Result Compatible   I-Stat beta hCG blood, ED     Status: None   Collection Time: 10/07/19  2:32 PM  Result Value Ref Range   I-stat hCG, quantitative <5.0 <5 mIU/mL   Comment 3          SARS Coronavirus 2 by RT PCR (hospital order, performed in [facility] hospital lab) Nasopharyngeal Nasopharyngeal Swab     Status: None   Collection Time: 10/08/19  6:25 AM    Specimen: Nasopharyngeal Swab  Result Value Ref Range   SARS Coronavirus 2 NEGATIVE NEGATIVE  Prepare RBC (crossmatch)     Status: None   Collection Time: 10/08/19  6:31 AM  Result Value Ref Range  Order Confirmation      ORDER PROCESSED BY BLOOD BANK Performed at [facility], [address], [place], [place] [address]   ABO/Rh     Status: None   Collection Time: 10/08/19  8:49 AM  Result Value Ref Range   ABO/RH(D)      A POS Performed at [facility], [address], [place], [place] [address]     US PELVIC COMPLETE WITH TRANSVAGINAL  Result Date: 10/08/2019 CLINICAL DATA:  Menorrhagia since 09/24/2019. EXAM: TRANSABDOMINAL AND TRANSVAGINAL ULTRASOUND OF PELVIS TECHNIQUE: Both transabdominal and transvaginal ultrasound examinations of the pelvis were performed. Transabdominal technique was performed for global imaging of the pelvis including uterus, ovaries, adnexal regions, and pelvic cul-de-sac. It was necessary to proceed with endovaginal exam following the transabdominal exam to visualize the endometrium. COMPARISON:  None FINDINGS: Uterus Measurements: 12.5 x 4.6 x 5.4 cm = volume: 164 mL. Three fibroids are identified. The largest is in the lower uterine segment measuring 5.6 x 3.3 x 5.7 cm. A second fibroid positioned posteriorly near the fundus measures 3.3 cm in diameter and a third to the right at the anterior aspect of the fundus measures 3.1 cm in diameter. Endometrium Thickness: 10 mm.  No focal abnormality visualized. Right ovary Measurements: 3.6 x 2.1 x 2.3 = volume: 8.9 mL. Normal appearance/no adnexal mass. Left ovary Measurements: 5.0 x 3.4 x 4.0 = volume: 35.6 mL. Normal appearance/no adnexal mass. Other findings No abnormal free fluid. IMPRESSION: The examination is positive for 3 uterine fibroids. The largest fibroid is in the lower uterine segment and measures 5.7 cm in diameter. Electronically Signed   By: Drusilla Kanner M.D.   On: 10/08/2019 11:07     Assessment/Plan: 48yo with heavy, irregular menstrual bleeding -Patient is counseled re: today's u/s findings.  The patient is informed of interval enlargement of multiple fibroids; largest measures 5.7 cm.  Normal appearing ovaries.  I suspect given recent change in menses this is mostly associated with perimenopausal syndrome, however, partially may be attributable to leiomyomata.   -Patient to receive 2u PRBCs and post-transfusion hemoglobin.   -Patient counseled re: recommendation to consider medication vs surgical management given severity of anemia.  She is offered hormonal tx, TXA and hysterectomy.  She prefers to not use hormones (refuses IUD) and is adamantly opposed to hysterectomy.  Will plan TXA at this time which will be prescribed for her to pick up today.  She understands it is to be taken prn and no greater than 5 days each month.  Should this not be adequate tx to transition to menopause, will consider hormonal medication.  -Patient to f/u in the office on Monday for recheck.  Mitchel Honour 10/08/2019, 1:28 PM

## 2019-10-08 NOTE — ED Provider Notes (Addendum)
[facility] EMERGENCY DEPARTMENT Provider Note   CSN: [id number] Arrival date & time: 10/07/19  1234     History Chief Complaint  Patient presents with  . Vaginal Bleeding    Megan Quinn is a 49 y.o. female.  HPI Patient presents with anemia and heavy GI bleeding. Has had increasingly heavy menses over the last few months but or last month much heavier. Had not been even regular would have heavy days then light days. However over the last couple days is more lightheadedness and dizziness. Had been seen at [facility] ER and told she was anemic but not given a number. Followed up with OB/GYN after she became more short of breath and dizzy. Fingerstick hemoglobin was reportedly 4. Get seen by Dr. Legrand Pitts and [facility]. Dr. Vincente Poli reportedly told her to come in for transfusion.    Past Medical History:  Diagnosis Date  . Endometrial polyp   . Irregular menstrual cycle   . Wears contact lenses     Patient Active Problem List   Diagnosis Date Noted  . Endometrial polyp 02/27/2014    Class: Present on Admission  . Status post hysteroscopic polypectomy 02/27/2014    Class: Status post    Past Surgical History:  Procedure Laterality Date  . DILATATION & CURETTAGE/HYSTEROSCOPY WITH MYOSURE N/A 02/27/2014   Procedure: DILATATION & CURETTAGE/HYSTEROSCOPY WITH MYOSURE;  Surgeon: Juluis Mire, MD;  Location: [facility];  Service: Gynecology;  Laterality: N/A;  . REDUCTION MAMMAPLASTY Bilateral 04/03/2018     OB History   No obstetric history on file.     No family history on file.  Social History   Tobacco Use  . Smoking status: Never Smoker  . Smokeless tobacco: Never Used  Substance Use Topics  . Alcohol use: No  . Drug use: No    Home Medications Prior to Admission medications   Not on File    Allergies    Ibuprofen  Review of Systems   Review of Systems  Constitutional: Negative for appetite change.  HENT:  Negative for congestion.   Respiratory: Positive for shortness of breath.   Cardiovascular: Negative for chest pain.  Gastrointestinal: Negative for abdominal pain.  Genitourinary: Positive for vaginal bleeding.  Musculoskeletal: Negative for back pain.  Skin: Negative for rash.  Neurological: Positive for dizziness and light-headedness.  Psychiatric/Behavioral: Negative for confusion.    Physical Exam Updated Vital Signs BP (!) 172/99   Pulse (!) 120   Temp (!) 97.4 F (36.3 C) (Oral)   Resp 19   Ht 5\' 4"  (1.626 m)   Wt 74.8 kg   LMP 09/23/2019   SpO2 100%   BMI 28.32 kg/m   Physical Exam Vitals and nursing note reviewed.  HENT:     Head: Atraumatic.  Eyes:     Pupils: Pupils are equal, round, and reactive to light.  Cardiovascular:     Rate and Rhythm: Tachycardia present.  Pulmonary:     Effort: Pulmonary effort is normal.  Abdominal:     Tenderness: There is no abdominal tenderness.  Genitourinary:    Comments: Mild vaginal bleeding. No red blood in vault. Musculoskeletal:     Cervical back: Neck supple.  Skin:    Coloration: Skin is pale.  Neurological:     Mental Status: She is alert and oriented to person, place, and time.     ED Results / Procedures / Treatments   Labs (all labs ordered are listed, but only abnormal  results are displayed) Labs Reviewed  CBC - Abnormal; Notable for the following components:      Result Value   RBC 1.98 (*)    Hemoglobin 5.6 (*)    HCT 18.3 (*)    Platelets 421 (*)    All other components within normal limits  COMPREHENSIVE METABOLIC PANEL - Abnormal; Notable for the following components:   Glucose, Bld 100 (*)    Calcium 8.6 (*)    Total Protein 6.1 (*)    Albumin 3.4 (*)    Alkaline Phosphatase 23 (*)    All other components within normal limits  I-STAT BETA HCG BLOOD, ED ([facility], [facility], [facility] ONLY)  TYPE AND SCREEN    EKG None  Radiology No results found.  Procedures Procedures (including critical care  time)  Medications Ordered in ED Medications - No data to display  ED Course  I have reviewed the triage vital signs and the nursing notes.  Pertinent labs & imaging results that were available during my care of the patient were reviewed by me and considered in my medical decision making (see chart for details).    MDM Rules/Calculators/A&P                         Patient with acute blood loss anemia. Symptomatic. Dizzy. Hemoglobin 5.6. Discussed with Dr. Vincente Poli. Thinks patient can be admitted to the medicine doctors. Recommends transfusion but does not necessarily need ultrasound now. That can be arranged as an outpatient for the procedures she needs. Does suggest Megace 20 mg a day up until the procedure. Will discuss with unassigned medicine for admission.  CRITICAL CARE Performed by: Benjiman Core Total critical care time: 30 minutes Critical care time was exclusive of separately billable procedures and treating other patients. Critical care was necessary to treat or prevent imminent or life-threatening deterioration. Critical care was time spent personally by me on the following activities: development of treatment plan with patient and/or surrogate as well as nursing, discussions with consultants, evaluation of patient's response to treatment, examination of patient, obtaining history from patient or surrogate, ordering and performing treatments and interventions, ordering and review of laboratory studies, ordering and review of radiographic studies, pulse oximetry and re-evaluation of patient's condition.  Final Clinical Impression(s) / ED Diagnoses Final diagnoses:  Abnormal uterine bleeding (AUB)  Acute blood loss anemia    Rx / DC Orders ED Discharge Orders    None       Benjiman Core, MD 10/08/19 [id number]    Benjiman Core, MD 10/08/19 [phone]

## 2019-10-08 NOTE — Progress Notes (Signed)
Patient was accepted in carryover by Dr. Rachael Darby, with sign out as below:  49 year old female who was sent to the emergency room by the OB/GYN office with anemia and vaginal bleeding. Found to have hemoglobin below 6. ER doctor discussed with GYN who recommended giving Megace and transfusing blood and remainder of work-up can be done as an outpatient. GYN wanted medicine to admit. Per ER doctor GYN did not want ultrasound obtained as they would do it as an outpatient  I would consult GYN so they can manage hormone therapy.   Our current service line agreement with OB/GYN states that patients presenting with a primary obstetrical/gynecologic issue should be admitted by their service.  The patient does not appear to have other active medical problems.  As such, I discussed the patient with the on-call doctor, Mitchel Honour, who has agreed to assume care of the patient.   Georgana Curio, M.D.

## 2019-10-08 NOTE — ED Notes (Addendum)
Pt Stated she was getting treated different she had insurance just like everybody else. She ask why has she being sitting in lobby for so long when doctor sent her to [facility]. The staff in the lobby explained the wait. Charge nurse is aware.

## 2019-10-09 LAB — TYPE AND SCREEN
ABO/RH(D): A POS
Antibody Screen: NEGATIVE
Unit division: 0
Unit division: 0

## 2019-10-09 LAB — BPAM RBC
Blood Product Expiration Date: 202109112359
Blood Product Expiration Date: 202109112359
ISSUE DATE / TIME: 202108241206
ISSUE DATE / TIME: 202108241543
Unit Type and Rh: 6200
Unit Type and Rh: 6200

## 2021-01-21 ENCOUNTER — Other Ambulatory Visit: Payer: Self-pay | Admitting: Obstetrics and Gynecology

## 2021-01-21 DIAGNOSIS — R928 Other abnormal and inconclusive findings on diagnostic imaging of breast: Secondary | ICD-10-CM

## 2021-03-20 ENCOUNTER — Other Ambulatory Visit: Payer: Self-pay

## 2021-03-20 ENCOUNTER — Ambulatory Visit
Admission: RE | Admit: 2021-03-20 | Discharge: 2021-03-20 | Disposition: A | Discharge status: Home / Self Care | Payer: BC Managed Care – PPO | Source: Ambulatory Visit | Attending: Obstetrics and Gynecology | Admitting: Obstetrics and Gynecology

## 2021-03-20 ENCOUNTER — Other Ambulatory Visit: Payer: Self-pay | Admitting: Obstetrics and Gynecology

## 2021-03-20 DIAGNOSIS — R928 Other abnormal and inconclusive findings on diagnostic imaging of breast: Secondary | ICD-10-CM

## 2021-03-20 DIAGNOSIS — R921 Mammographic calcification found on diagnostic imaging of breast: Secondary | ICD-10-CM

## 2021-04-15 ENCOUNTER — Ambulatory Visit
Admission: RE | Admit: 2021-04-15 | Discharge: 2021-04-15 | Disposition: A | Discharge status: Home / Self Care | Payer: BC Managed Care – PPO | Source: Ambulatory Visit | Attending: Obstetrics and Gynecology | Admitting: Obstetrics and Gynecology

## 2021-04-15 DIAGNOSIS — R921 Mammographic calcification found on diagnostic imaging of breast: Secondary | ICD-10-CM

## 2021-04-15 DIAGNOSIS — R928 Other abnormal and inconclusive findings on diagnostic imaging of breast: Secondary | ICD-10-CM

## 2022-06-09 ENCOUNTER — Other Ambulatory Visit: Payer: Self-pay | Admitting: Obstetrics and Gynecology

## 2022-06-09 DIAGNOSIS — Z1231 Encounter for screening mammogram for malignant neoplasm of breast: Secondary | ICD-10-CM

## 2022-06-22 ENCOUNTER — Encounter: Payer: Self-pay | Admitting: Internal Medicine

## 2022-07-06 ENCOUNTER — Ambulatory Visit (AMBULATORY_SURGERY_CENTER): Payer: BC Managed Care – PPO

## 2022-07-06 VITALS — Ht 64.0 in | Wt 162.0 lb

## 2022-07-06 DIAGNOSIS — Z1211 Encounter for screening for malignant neoplasm of colon: Secondary | ICD-10-CM

## 2022-07-06 MED ORDER — NA SULFATE-K SULFATE-MG SULF 17.5-3.13-1.6 GM/177ML PO SOLN: 1.0000 | Freq: Once | ORAL | 0 refills | Status: AC

## 2022-07-06 NOTE — Progress Notes (Signed)
No egg or soy allergy known to patient  No issues known to pt with past sedation with any surgeries or procedures Patient denies ever being told they had issues or difficulty with intubation  No FH of Malignant Hyperthermia Pt is not on diet pills Pt is not on  home 02  Pt is not on blood thinners  Pt has issue with constipation  No A fib or A flutter Have any cardiac testing pending-- Pt instructed to use Single[employer]com or GoodRx for a price reduction on prep  Can ambulate without assistance

## 2022-07-07 ENCOUNTER — Encounter: Payer: Self-pay | Admitting: Internal Medicine

## 2022-07-14 ENCOUNTER — Ambulatory Visit: Payer: BC Managed Care – PPO

## 2022-07-18 ENCOUNTER — Telehealth: Payer: Self-pay | Admitting: Internal Medicine

## 2022-07-18 NOTE — Telephone Encounter (Signed)
Inbound call from patient requesting to speak with a nurse, she is schedule for a procedure 6/5, she stated she is been taking Amoxicillin and wan to know when does she need to stop take it.Please advise

## 2022-07-18 NOTE — Telephone Encounter (Signed)
Called patient back and she had a question about being on amoxicillin prescribed by the dentist, wondering if it would be ok if she took it the morning of her procedure. I advised that it should be fine and to take with plenty of clear liquids so it wouldn't upset her stomach. She agreed and said that it would be her last dose and it has not upset her stomach at all. No further questions.

## 2022-07-20 ENCOUNTER — Ambulatory Visit (AMBULATORY_SURGERY_CENTER): Payer: BC Managed Care – PPO | Admitting: Internal Medicine

## 2022-07-20 ENCOUNTER — Encounter: Payer: Self-pay | Admitting: Internal Medicine

## 2022-07-20 VITALS — BP 134/79 | HR 58 | Temp 98.7°F | Resp 13 | Ht 64.0 in | Wt 162.0 lb

## 2022-07-20 DIAGNOSIS — D122 Benign neoplasm of ascending colon: Secondary | ICD-10-CM | POA: Diagnosis not present

## 2022-07-20 DIAGNOSIS — D123 Benign neoplasm of transverse colon: Secondary | ICD-10-CM | POA: Diagnosis not present

## 2022-07-20 DIAGNOSIS — D120 Benign neoplasm of cecum: Secondary | ICD-10-CM

## 2022-07-20 DIAGNOSIS — Z1211 Encounter for screening for malignant neoplasm of colon: Secondary | ICD-10-CM | POA: Diagnosis present

## 2022-07-20 MED ORDER — SODIUM CHLORIDE 0.9 % IV SOLN: 500.0000 mL | Freq: Once | INTRAVENOUS | Status: DC

## 2022-07-20 NOTE — Op Note (Signed)
[facility] Patient Name: Megan Quinn Procedure Date: 07/20/2022 2:17 PM MRN: [id number] Endoscopist: Particia Lather , , [id number] Age: 52 Referring MD:  Date of Birth: January 01, 1971 Gender: Female Account #: [id number] Procedure:                Colonoscopy Indications:              Screening for colorectal malignant neoplasm, This                            is the patient's first colonoscopy Medicines:                Monitored Anesthesia Care Procedure:                Pre-Anesthesia Assessment:                           - Prior to the procedure, a History and Physical                            was performed, and patient medications and                            allergies were reviewed. The patient's tolerance of                            previous anesthesia was also reviewed. The risks                            and benefits of the procedure and the sedation                            options and risks were discussed with the patient.                            All questions were answered, and informed consent                            was obtained. Prior Anticoagulants: The patient has                            taken no anticoagulant or antiplatelet agents. ASA                            Grade Assessment: II - A patient with mild systemic                            disease. After reviewing the risks and benefits,                            the patient was deemed in satisfactory condition to                            undergo the procedure.  After obtaining informed consent, the colonoscope                            was passed under direct vision. Throughout the                            procedure, the patient's blood pressure, pulse, and                            oxygen saturations were monitored continuously. The                            Olympus Scope SN [id number] was introduced through the                            anus and advanced  to the the terminal ileum. The                            colonoscopy was performed without difficulty. The                            patient tolerated the procedure well. The quality                            of the bowel preparation was excellent. The                            terminal ileum, ileocecal valve, appendiceal                            orifice, and rectum were photographed. Scope In: 2:44:39 PM Scope Out: 3:01:33 PM Scope Withdrawal Time: 0 hours 13 minutes 17 seconds  Total Procedure Duration: 0 hours 16 minutes 54 seconds  Findings:                 The terminal ileum appeared normal.                           Four sessile polyps were found in the transverse                            colon, ascending colon and cecum. The polyps were 3                            to 5 mm in size. These polyps were removed with a                            cold snare. Resection and retrieval were complete.                           Non-bleeding internal hemorrhoids were found during                            retroflexion. Complications:  No immediate complications. Estimated Blood Loss:     Estimated blood loss was minimal. Impression:               - The examined portion of the ileum was normal.                           - Four 3 to 5 mm polyps in the transverse colon, in                            the ascending colon and in the cecum, removed with                            a cold snare. Resected and retrieved.                           - Non-bleeding internal hemorrhoids. Recommendation:           - Discharge patient to home (with escort).                           - Await pathology results.                           - The findings and recommendations were discussed                            with the patient. Dr Particia Lather "Alan Ripper" Leonides Schanz,  07/20/2022 3:04:34 PM

## 2022-07-20 NOTE — Progress Notes (Signed)
GASTROENTEROLOGY PROCEDURE H&P NOTE   Primary Care Physician: Richardean Chimera, MD    Reason for Procedure:   Colon cancer screening  Plan:    Colonoscopy  Patient is appropriate for endoscopic procedure(s) in the ambulatory ([facility]) setting.  The nature of the procedure, as well as the risks, benefits, and alternatives were carefully and thoroughly reviewed with the patient. Ample time for discussion and questions allowed. The patient understood, was satisfied, and agreed to proceed.     HPI: Megan Quinn is a 52 y.o. female who presents for colonoscopy for colon cancer screening. Denies blood in stools, changes in bowel habits, or unintentional weight loss. Denies family history of colon cancer. This is her first colonoscopy  Past Medical History:  Diagnosis Date   Endometrial polyp    Irregular menstrual cycle    Wears contact lenses     Past Surgical History:  Procedure Laterality Date   DILATATION & CURETTAGE/HYSTEROSCOPY WITH MYOSURE N/A 02/27/2014   Procedure: DILATATION & CURETTAGE/HYSTEROSCOPY WITH MYOSURE;  Surgeon: Juluis Mire, MD;  Location: [facility];  Service: Gynecology;  Laterality: N/A;   REDUCTION MAMMAPLASTY Bilateral 04/03/2018    Prior to Admission medications   Medication Sig Start Date End Date Taking? Authorizing Provider  Multiple Vitamins-Iron (ONE-TABLET-DAILY/IRON PO) Take 1 tablet by mouth daily.    [provider]    Current Outpatient Medications  Medication Sig Dispense Refill   Multiple Vitamins-Iron (ONE-TABLET-DAILY/IRON PO) Take 1 tablet by mouth daily.     Current Facility-Administered Medications  Medication Dose Route Frequency Provider Last Rate Last Admin   0.9 %  sodium chloride infusion  500 mL Intravenous Once Imogene Burn, MD        Allergies as of 07/20/2022 - Review Complete 07/20/2022  Allergen Reaction Noted   Ibuprofen Swelling 02/25/2014    Family History  Problem Relation Age of  Onset   Colon cancer Neg Hx    Colon polyps Neg Hx    Esophageal cancer Neg Hx    Rectal cancer Neg Hx    Stomach cancer Neg Hx     Social History   Socioeconomic History   Marital status: Single    Spouse name: Not on file   Number of children: Not on file   Years of education: Not on file   Highest education level: Not on file  Occupational History   Not on file  Tobacco Use   Smoking status: Never   Smokeless tobacco: Never  Substance and Sexual Activity   Alcohol use: No   Drug use: No   Sexual activity: Not on file  Other Topics Concern   Not on file  Social History Narrative   Not on file   Social Determinants of Health   Financial Resource Strain: Not on file  Food Insecurity: Not on file  Transportation Needs: Not on file  Physical Activity: Not on file  Stress: Not on file  Social Connections: Not on file  Intimate Partner Violence: Not on file    Physical Exam: Vital signs in last 24 hours: BP 111/70   Pulse 70   Temp 98.7 F (37.1 C)   Resp 14   Ht 5\' 4"  (1.626 m)   Wt 162 lb (73.5 kg)   SpO2 100%   BMI 27.81 kg/m  GEN: NAD EYE: Sclerae anicteric ENT: MMM CV: Non-tachycardic Pulm: No increased work of breathing GI: Soft, NT/ND NEURO:  Alert & Oriented   Eulah Pont, MD [place] [facility]  07/20/2022 3:04 PM

## 2022-07-20 NOTE — Progress Notes (Signed)
Called to room to assist during endoscopic procedure.  Patient ID and intended procedure confirmed with present staff. Received instructions for my participation in the procedure from the performing physician.  

## 2022-07-20 NOTE — Progress Notes (Signed)
A and O x3. Report to RN. Tolerated MAC anesthesia well. 

## 2022-07-20 NOTE — Patient Instructions (Signed)
Handout on polyps given.    YOU HAD AN ENDOSCOPIC PROCEDURE TODAY AT THE [facility]:   Refer to the procedure report that was given to you for any specific questions about what was found during the examination.  If the procedure report does not answer your questions, please call your gastroenterologist to clarify.  If you requested that your care partner not be given the details of your procedure findings, then the procedure report has been included in a sealed envelope for you to review at your convenience later.  YOU SHOULD EXPECT: Some feelings of bloating in the abdomen. Passage of more gas than usual.  Walking can help get rid of the air that was put into your GI tract during the procedure and reduce the bloating. If you had a lower endoscopy (such as a colonoscopy or flexible sigmoidoscopy) you may notice spotting of blood in your stool or on the toilet paper. If you underwent a bowel prep for your procedure, you may not have a normal bowel movement for a few days.  Please Note:  You might notice some irritation and congestion in your nose or some drainage.  This is from the oxygen used during your procedure.  There is no need for concern and it should clear up in a day or so.  SYMPTOMS TO REPORT IMMEDIATELY:  Following lower endoscopy (colonoscopy or flexible sigmoidoscopy):  Excessive amounts of blood in the stool  Significant tenderness or worsening of abdominal pains  Swelling of the abdomen that is new, acute  Fever of 100F or higher   For urgent or emergent issues, a gastroenterologist can be reached at any hour by calling [phone]. Do not use MyChart messaging for urgent concerns.    DIET:  We do recommend a small meal at first, but then you may proceed to your regular diet.  Drink plenty of fluids but you should avoid alcoholic beverages for 24 hours.  ACTIVITY:  You should plan to take it easy for the rest of today and you should NOT DRIVE or use heavy  machinery until tomorrow (because of the sedation medicines used during the test).    FOLLOW UP: Our staff will call the number listed on your records the next business day following your procedure.  We will call around 7:15- 8:00 am to check on you and address any questions or concerns that you may have regarding the information given to you following your procedure. If we do not reach you, we will leave a message.     If any biopsies were taken you will be contacted by phone or by letter within the next 1-3 weeks.  Please call us at [phone] if you have not heard about the biopsies in 3 weeks.    SIGNATURES/CONFIDENTIALITY: You and/or your care partner have signed paperwork which will be entered into your electronic medical record.  These signatures attest to the fact that that the information above on your After Visit Summary has been reviewed and is understood.  Full responsibility of the confidentiality of this discharge information lies with you and/or your care-partner.  

## 2022-07-21 ENCOUNTER — Telehealth: Payer: Self-pay | Admitting: *Deleted

## 2022-07-21 NOTE — Telephone Encounter (Signed)
  Follow up Call-    Row Labels 07/20/2022    2:26 PM  Call back number   Section Header. No data exists in this row.   Post procedure Call Back phone  #   [phone]  Permission to leave phone message   Yes     Patient questions:  Do you have a fever, pain , or abdominal swelling? No. Pain Score  0 *  Have you tolerated food without any problems? Yes.    Have you been able to return to your normal activities? Yes.    Do you have any questions about your discharge instructions: Diet   No. Medications  No. Follow up visit  No.  Do you have questions or concerns about your Care? No.  Actions: * If pain score is 4 or above: No action needed, pain <4.

## 2022-07-25 ENCOUNTER — Encounter: Payer: Self-pay | Admitting: Internal Medicine

## 2022-08-02 ENCOUNTER — Ambulatory Visit: Payer: BC Managed Care – PPO

## 2022-08-15 ENCOUNTER — Ambulatory Visit: Payer: BC Managed Care – PPO

## 2022-08-30 ENCOUNTER — Inpatient Hospital Stay: Admission: RE | Admit: 2022-08-30 | Payer: BC Managed Care – PPO | Source: Ambulatory Visit

## 2023-11-03 IMAGING — MG MM BREAST BX W LOC DEV 1ST LESION IMAGE BX SPEC STEREO GUIDE*L*
8 series · 8 of 20 positions shown · non-contrast
Comparison: Previous exams.
COMPARISON: Previous exams.

Addendum:
CLINICAL DATA: Patient with indeterminate left breast
calcifications.

EXAM:
LEFT BREAST STEREOTACTIC CORE NEEDLE BIOPSY

[L (1 of 4)]
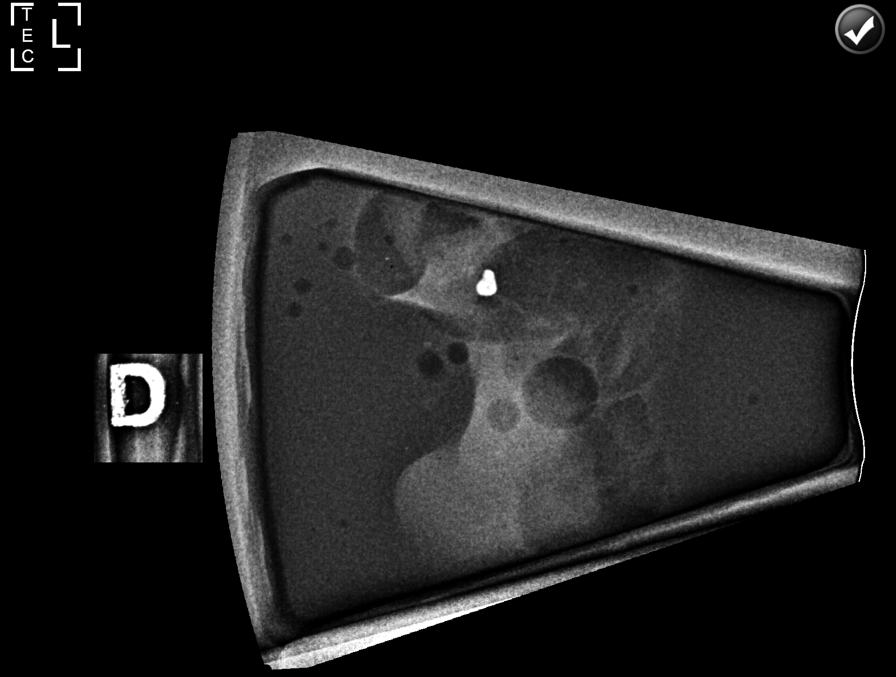

[L (2 of 4)]
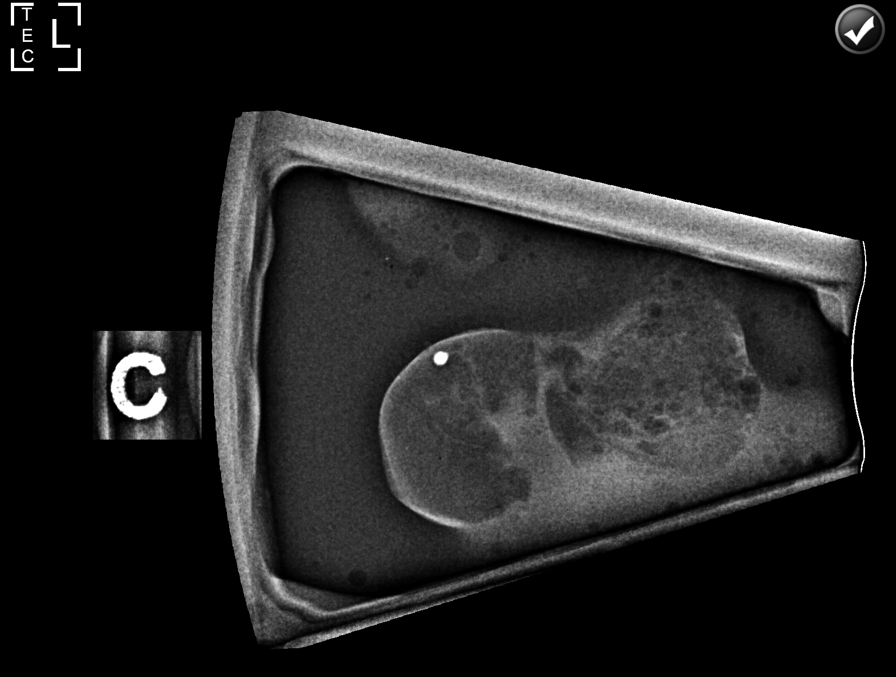

[L (3 of 4)]
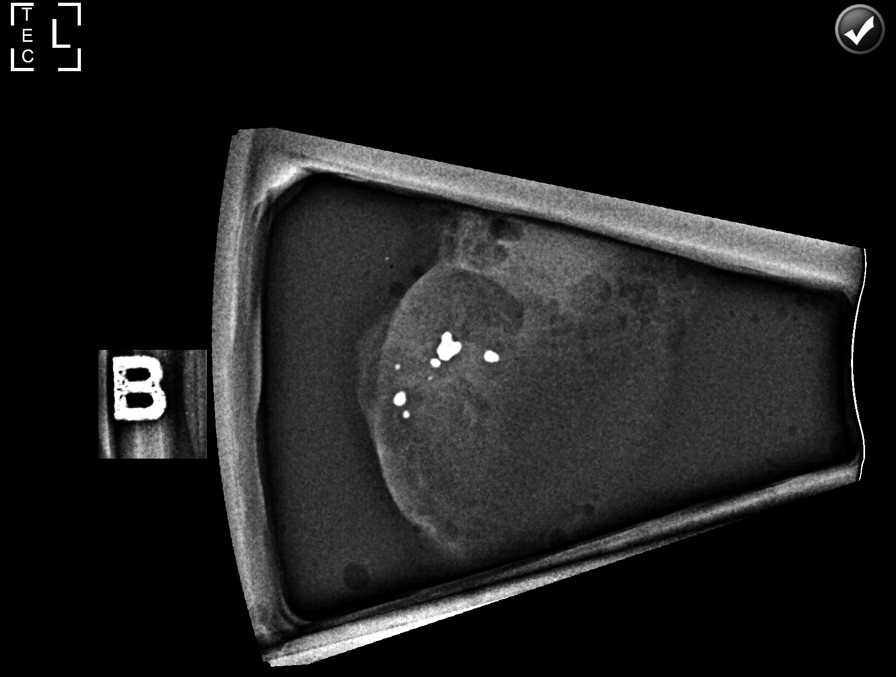

[L (4 of 4)]
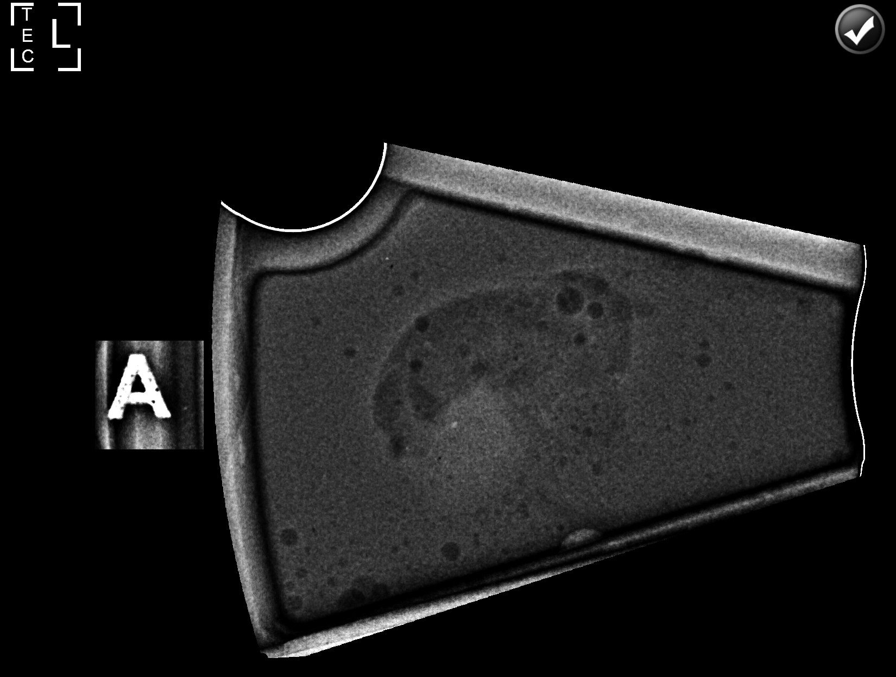

[L CC]
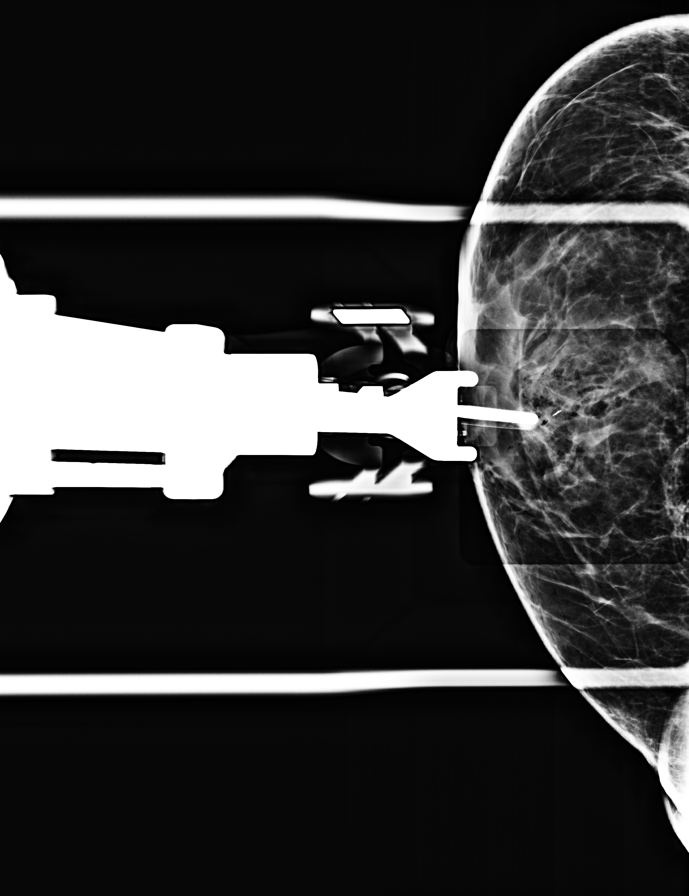

[L CC tomo (1 of 3) · tomo slice 31/60.0]
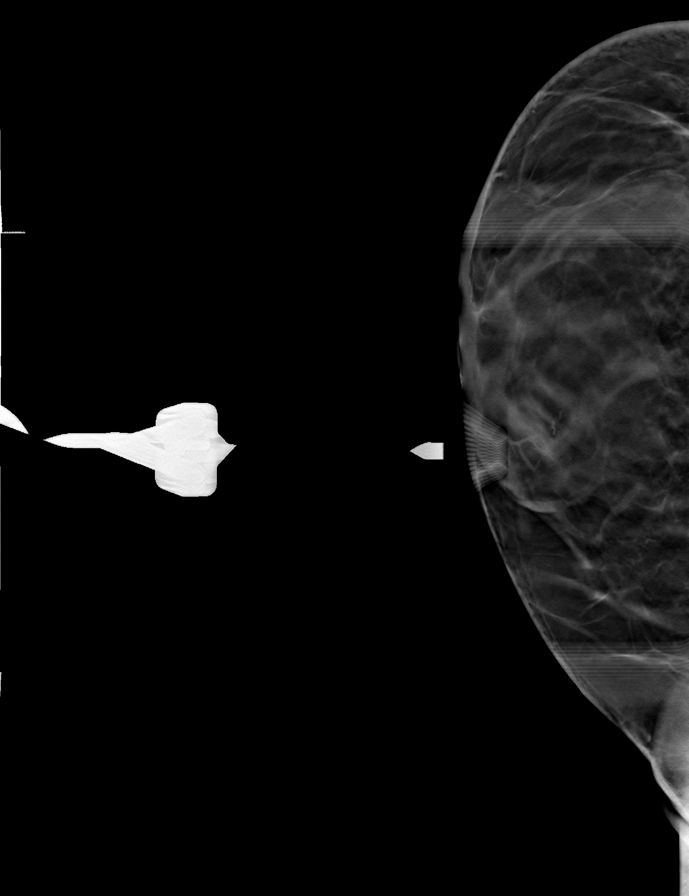

[L CC tomo (2 of 3) · tomo slice 31/60.0]
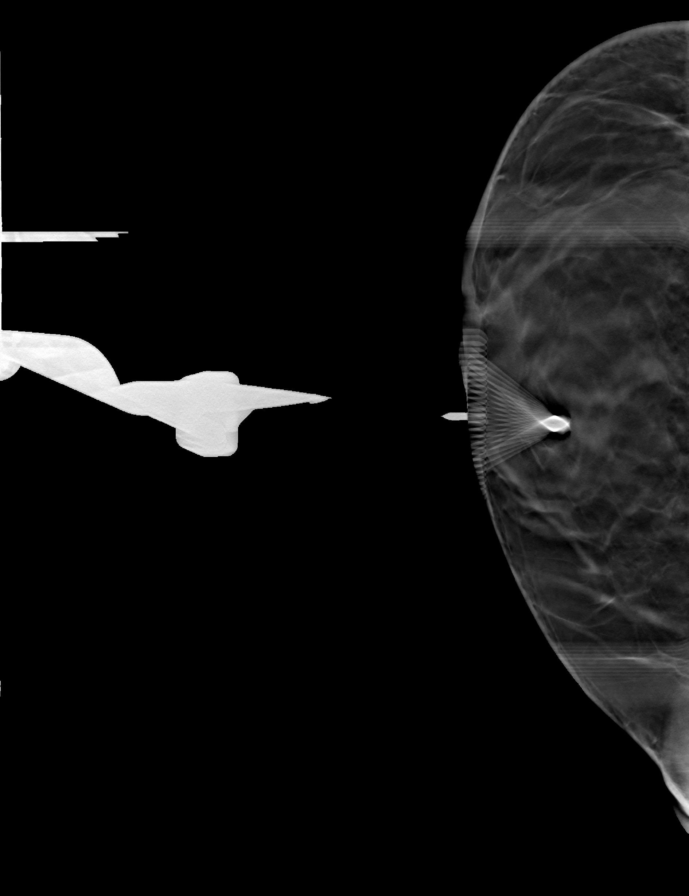

[L CC tomo (3 of 3) · tomo slice 31/61.0]
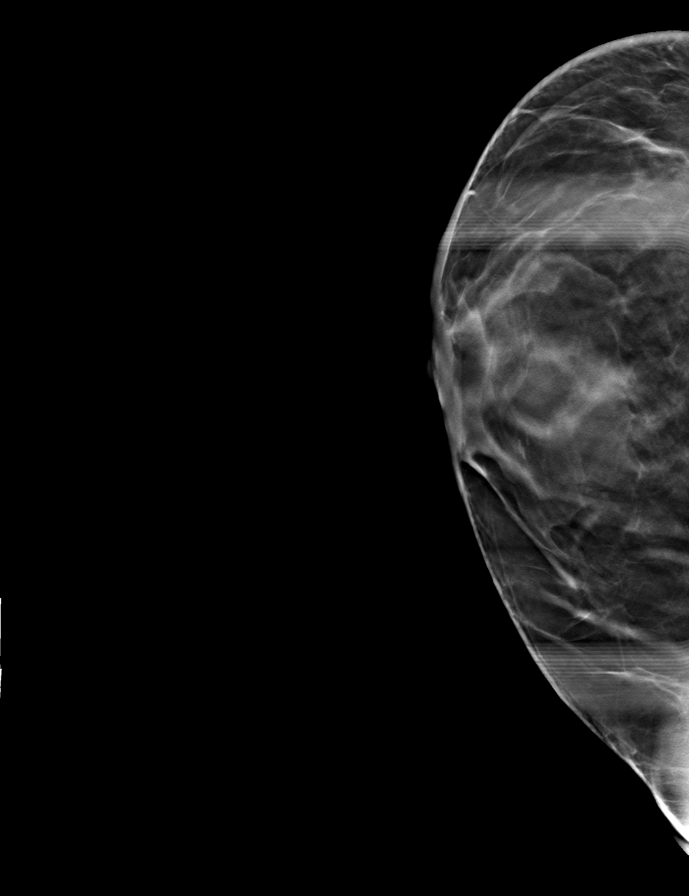

[8 of 20 positions shown; findings below may reference images not displayed]



Using sterile technique and 1% Lidocaine as local anesthetic, under
stereotactic guidance, a 9 gauge vacuum assisted device was used to
perform core needle biopsy of calcifications upper-outer left breast
using a cranial approach. Specimen radiograph was performed showing
calcifications. Specimens with calcifications are identified for
pathology.

Lesion quadrant: Upper outer quadrant

At the conclusion of the procedure, X shaped tissue marker clip was
deployed into the biopsy cavity. Follow-up 2-view mammogram was
performed and dictated separately.
IMPRESSION: Stereotactic-guided biopsy of left breast calcifications. No
apparent complications.

ADDENDUM:
Pathology revealed BENIGN BREAST TISSUE WITH INCREASED STROMAL
FIBROSIS, PIGMENTED HISTIOCYTES AND CALCIFICATIONS of the LEFT
breast, upper outer, (x clip). The differential diagnosis includes
previously ruptured cyst or healed fat necrosis. This was found to
be concordant by Dr. Ashutosh Wisniewski.

Pathology results were discussed with the patient by telephone. The
patient reported doing well after the biopsy with tenderness at the
site. Post biopsy instructions and care were reviewed and questions
were answered. The patient was encouraged to call The [REDACTED]

The patient was instructed to return for annual screening

Pathology results reported by Henderson Del Sol, RN on 04/16/2021.



Using sterile technique and 1% Lidocaine as local anesthetic, under
stereotactic guidance, a 9 gauge vacuum assisted device was used to
perform core needle biopsy of calcifications upper-outer left breast
using a cranial approach. Specimen radiograph was performed showing
calcifications. Specimens with calcifications are identified for
pathology.

Lesion quadrant: Upper outer quadrant

At the conclusion of the procedure, X shaped tissue marker clip was
deployed into the biopsy cavity. Follow-up 2-view mammogram was
performed and dictated separately.
IMPRESSION: Stereotactic-guided biopsy of left breast calcifications. No
apparent complications.
